# Patient Record
Sex: Female | Born: 1985 | Race: White | Hispanic: No | Marital: Single | Smoking: Never smoker
Health system: Southern US, Community
[De-identification: ages and names within clinical notes are randomized; demographics above are authoritative.]

## PROBLEM LIST (undated history)

## (undated) DIAGNOSIS — K219 Gastro-esophageal reflux disease without esophagitis: Secondary | ICD-10-CM

## (undated) DIAGNOSIS — N83201 Unspecified ovarian cyst, right side: Secondary | ICD-10-CM

## (undated) DIAGNOSIS — Z973 Presence of spectacles and contact lenses: Secondary | ICD-10-CM

## (undated) DIAGNOSIS — Z8619 Personal history of other infectious and parasitic diseases: Secondary | ICD-10-CM

## (undated) HISTORY — DX: Personal history of other infectious and parasitic diseases: Z86.19

## (undated) HISTORY — PX: WISDOM TOOTH EXTRACTION: SHX21

## (undated) HISTORY — PX: NO PAST SURGERIES: SHX2092

---
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
## 2018-10-13 ENCOUNTER — Encounter: Payer: Self-pay | Admitting: Physician Assistant

## 2018-10-13 ENCOUNTER — Ambulatory Visit (HOSPITAL_BASED_OUTPATIENT_CLINIC_OR_DEPARTMENT_OTHER)
Admission: RE | Admit: 2018-10-13 | Discharge: 2018-10-13 | Disposition: A | Discharge status: Home / Self Care | Payer: BC Managed Care – PPO | Source: Ambulatory Visit | Attending: Physician Assistant | Admitting: Physician Assistant

## 2018-10-13 ENCOUNTER — Ambulatory Visit (INDEPENDENT_AMBULATORY_CARE_PROVIDER_SITE_OTHER): Payer: BC Managed Care – PPO | Admitting: Physician Assistant

## 2018-10-13 ENCOUNTER — Other Ambulatory Visit: Payer: Self-pay

## 2018-10-13 VITALS — BP 100/70 | HR 92 | Temp 98.4°F | Resp 14 | Ht 64.0 in | Wt 150.0 lb

## 2018-10-13 DIAGNOSIS — Z299 Encounter for prophylactic measures, unspecified: Secondary | ICD-10-CM

## 2018-10-13 DIAGNOSIS — R51 Headache: Secondary | ICD-10-CM | POA: Insufficient documentation

## 2018-10-13 DIAGNOSIS — R519 Headache, unspecified: Secondary | ICD-10-CM

## 2018-10-13 MED ORDER — KETOROLAC TROMETHAMINE 60 MG/2ML IM SOLN
60.0000 mg | Freq: Once | INTRAMUSCULAR | Status: AC
  Administered 2018-10-13: 16:00:00 60 mg via INTRAMUSCULAR

## 2018-10-13 NOTE — Progress Notes (Signed)
Jeanne [name] 34 y.o. female presents to office today for NP appt. Per PCP, Raiford Noble, PA, administered KETORIAC TROMETHAMINE 60 mg per 2 mL IM right ventrogluteal. Patient tolerated well.

## 2018-10-13 NOTE — Patient Instructions (Signed)
Please go to the lab today for blood work.  I will call you with your results. We will alter treatment regimen(s) if indicated by your results.   Please stop at the front desk to speak with Danae Chen about your CT scan.  If you note any acutely worsening symptoms before workup is complete, please go to the ER.

## 2018-10-13 NOTE — Progress Notes (Signed)
Patient presents to clinic today to establish care.  Acute Concerns: Patient endorses a headache x 5.5 days. Notes on Wed night -- driving home from grocery -- acute onset of headache (not severe) but was noticeable and not like anything she has had. Notes these are not normal for her. Is bilaterally in temporal regions. Non-radiating. No neck stiffness. Denies nausea/vomiting, photophobia or phonophobia. Denies trauma or injury. Denies change to diet or hydration. Denies no vision changes.Denies sinus pressure, nasal congestion or other URI symptoms.  Aching that is just present. Ranks about 2-3/10. LMP -- [facility] July 20th. Has taken tylenol and ibuprofen with no improvement in symptoms.   Past Medical History:  Diagnosis Date  . GERD (gastroesophageal reflux disease)   . History of chickenpox     Past Surgical History:  Procedure Laterality Date  . NO PAST SURGERIES      No current outpatient medications on file prior to visit.   No current facility-administered medications on file prior to visit.     No Known Allergies  Family History  Problem Relation Age of Onset  . Colon polyps Mother 35  . Alcohol abuse Father   . Early death Father   . Arthritis Maternal Grandmother   . Dementia Maternal Grandmother   . Alcohol abuse Maternal Grandfather   . Diabetes Paternal Grandmother   . Heart disease Paternal Grandmother   . Diabetes Paternal Grandfather   . Heart disease Paternal Grandfather   . Hyperlipidemia Paternal Grandfather   . Hypertension Paternal Grandfather   . Kidney disease Paternal Grandfather   . Stroke Paternal Grandfather     Social History   Socioeconomic History  . Marital status: Single    Spouse name: Not on file  . Number of children: Not on file  . Years of education: Not on file  . Highest education level: Not on file  Occupational History  . Not on file  Social Needs  . Financial resource strain: Not on file  . Food insecurity   Worry: Not on file    Inability: Not on file  . Transportation needs    Medical: Not on file    Non-medical: Not on file  Tobacco Use  . Smoking status: Never Smoker  . Smokeless tobacco: Never Used  Substance and Sexual Activity  . Alcohol use: Yes  . Drug use: Not Currently  . Sexual activity: Yes  Lifestyle  . Physical activity    Days per week: Not on file    Minutes per session: Not on file  . Stress: Not on file  Relationships  . Social Herbalist on phone: Not on file    Gets together: Not on file    Attends religious service: Not on file    Active member of club or organization: Not on file    Attends meetings of clubs or organizations: Not on file    Relationship status: Not on file  . Intimate partner violence    Fear of current or ex partner: Not on file    Emotionally abused: Not on file    Physically abused: Not on file    Forced sexual activity: Not on file  Other Topics Concern  . Not on file  Social History Narrative  . Not on file   Review of Systems  Constitutional: Negative for chills, fever, malaise/fatigue and weight loss.  HENT: Negative for ear pain and hearing loss.   Eyes: Negative for blurred vision, double vision,  photophobia and pain.  Respiratory: Negative for cough and shortness of breath.   Cardiovascular: Negative for chest pain and palpitations.  Neurological: Positive for headaches. Negative for dizziness, sensory change, speech change, focal weakness, seizures, loss of consciousness and weakness.  Psychiatric/Behavioral: Negative for depression. The patient is not nervous/anxious.    BP 100/70   Pulse 92   Temp 98.4 F (36.9 C) (Skin)   Resp 14   Ht '5\' 4"'$  (1.626 m)   Wt 150 lb (68 kg)   SpO2 99%   BMI 25.75 kg/m   Physical Exam Vitals signs reviewed.  Constitutional:      Appearance: She is well-developed.  HENT:     Head: Normocephalic and atraumatic.     Mouth/Throat:     Mouth: Mucous membranes are moist.   Eyes:     Conjunctiva/sclera: Conjunctivae normal.     Pupils: Pupils are equal, round, and reactive to light.  Neck:     Musculoskeletal: Neck supple.  Cardiovascular:     Rate and Rhythm: Normal rate and regular rhythm.     Heart sounds: Normal heart sounds.  Pulmonary:     Effort: Pulmonary effort is normal.     Breath sounds: Normal breath sounds.  Neurological:     General: No focal deficit present.     Mental Status: She is alert and oriented to person, place, and time.     GCS: GCS eye subscore is 4. GCS verbal subscore is 5. GCS motor subscore is 6.     Cranial Nerves: No cranial nerve deficit or facial asymmetry.     Sensory: No sensory deficit.  Psychiatric:        Mood and Affect: Mood normal. Mood is not anxious.        Speech: Speech normal.    Assessment/Plan: 1. Preventive measure Labs today.  - Comp Met (CMET) - TSH - Lipid panel  2. Acute intractable headache, unspecified headache type Exam unremarkable but this is a new headache for her, present for 5 days without improvement despite home measures. Giving intractable headache will check CT to r/o any concerning findings. Labs today as noted. IM Toradol given for abortive therapy. ER precautions reviewed with patient.  - CBC w/Diff - Comp Met (CMET) - Sedimentation rate - TSH - ketorolac (TORADOL) injection 60 mg - CT Head Wo Contrast; Future   Leeanne Rio, PA-C

## 2018-10-14 ENCOUNTER — Other Ambulatory Visit: Payer: Self-pay | Admitting: Physician Assistant

## 2018-10-14 DIAGNOSIS — G4452 New daily persistent headache (NDPH): Secondary | ICD-10-CM

## 2018-10-14 LAB — CBC WITH DIFFERENTIAL/PLATELET
Basophils Absolute: 0.1 10*3/uL (ref 0.0–0.1)
Basophils Relative: 0.8 % (ref 0.0–3.0)
Eosinophils Absolute: 0.2 10*3/uL (ref 0.0–0.7)
Eosinophils Relative: 2.6 % (ref 0.0–5.0)
HCT: 37.9 % (ref 36.0–46.0)
Hemoglobin: 12.5 g/dL (ref 12.0–15.0)
Lymphocytes Relative: 28.6 % (ref 12.0–46.0)
Lymphs Abs: 1.8 10*3/uL (ref 0.7–4.0)
MCHC: 32.9 g/dL (ref 30.0–36.0)
MCV: 85.7 fl (ref 78.0–100.0)
Monocytes Absolute: 0.4 10*3/uL (ref 0.1–1.0)
Monocytes Relative: 6.7 % (ref 3.0–12.0)
Neutro Abs: 3.8 10*3/uL (ref 1.4–7.7)
Neutrophils Relative %: 61.3 % (ref 43.0–77.0)
Platelets: 194 10*3/uL (ref 150.0–400.0)
RBC: 4.42 Mil/uL (ref 3.87–5.11)
RDW: 14.4 % (ref 11.5–15.5)
WBC: 6.2 10*3/uL (ref 4.0–10.5)

## 2018-10-14 LAB — COMPREHENSIVE METABOLIC PANEL
ALT: 12 U/L (ref 0–35)
AST: 14 U/L (ref 0–37)
Albumin: 4.5 g/dL (ref 3.5–5.2)
Alkaline Phosphatase: 40 U/L (ref 39–117)
BUN: 14 mg/dL (ref 6–23)
CO2: 26 mEq/L (ref 19–32)
Calcium: 9.5 mg/dL (ref 8.4–10.5)
Chloride: 105 mEq/L (ref 96–112)
Creatinine, Ser: 0.71 mg/dL (ref 0.40–1.20)
GFR: 93.39 mL/min (ref >60.00)
Glucose, Bld: 94 mg/dL (ref 70–99)
Potassium: 4.1 mEq/L (ref 3.5–5.1)
Sodium: 138 mEq/L (ref 135–145)
Total Bilirubin: 0.4 mg/dL (ref 0.2–1.2)
Total Protein: 6.9 g/dL (ref 6.0–8.3)

## 2018-10-14 LAB — LIPID PANEL
Cholesterol: 158 mg/dL (ref 0–200)
HDL: 49.9 mg/dL (ref >39.00)
NonHDL: 108.02
Total CHOL/HDL Ratio: 3
Triglycerides: 220 mg/dL — ABNORMAL HIGH (ref 0.0–149.0)
VLDL: 44 mg/dL — ABNORMAL HIGH (ref 0.0–40.0)

## 2018-10-14 LAB — LDL CHOLESTEROL, DIRECT: Direct LDL: 76 mg/dL

## 2018-10-14 LAB — TSH: TSH: 2.12 u[IU]/mL (ref 0.35–4.50)

## 2018-10-14 LAB — SEDIMENTATION RATE: Sed Rate: 7 mm/hr (ref 0–20)

## 2018-10-30 ENCOUNTER — Encounter: Payer: Self-pay | Admitting: Physician Assistant

## 2018-10-30 NOTE — Telephone Encounter (Signed)
Please check on status of MRI and reply to patient. Thank you.

## 2018-11-01 ENCOUNTER — Other Ambulatory Visit: Payer: Self-pay

## 2018-11-01 ENCOUNTER — Ambulatory Visit (HOSPITAL_BASED_OUTPATIENT_CLINIC_OR_DEPARTMENT_OTHER)
Admission: RE | Admit: 2018-11-01 | Discharge: 2018-11-01 | Disposition: A | Discharge status: Home / Self Care | Payer: BC Managed Care – PPO | Source: Ambulatory Visit | Attending: Physician Assistant | Admitting: Physician Assistant

## 2018-11-01 DIAGNOSIS — R51 Headache: Secondary | ICD-10-CM | POA: Diagnosis not present

## 2018-11-01 DIAGNOSIS — G4452 New daily persistent headache (NDPH): Secondary | ICD-10-CM | POA: Diagnosis not present

## 2018-11-01 MED ORDER — GADOBUTROL 1 MMOL/ML IV SOLN
6.8000 mL | Freq: Once | INTRAVENOUS | Status: AC | PRN
  Administered 2018-11-01: 11:00:00 6.8 mL via INTRAVENOUS

## 2019-01-27 DIAGNOSIS — Z01419 Encounter for gynecological examination (general) (routine) without abnormal findings: Secondary | ICD-10-CM | POA: Diagnosis not present

## 2019-01-27 DIAGNOSIS — Z3202 Encounter for pregnancy test, result negative: Secondary | ICD-10-CM | POA: Diagnosis not present

## 2019-01-27 DIAGNOSIS — R1031 Right lower quadrant pain: Secondary | ICD-10-CM | POA: Diagnosis not present

## 2019-01-27 DIAGNOSIS — Z6826 Body mass index (BMI) 26.0-26.9, adult: Secondary | ICD-10-CM | POA: Diagnosis not present

## 2019-08-18 ENCOUNTER — Other Ambulatory Visit: Payer: Self-pay | Admitting: Radiology

## 2019-08-18 DIAGNOSIS — R1031 Right lower quadrant pain: Secondary | ICD-10-CM

## 2019-09-09 ENCOUNTER — Ambulatory Visit
Admission: RE | Admit: 2019-09-09 | Discharge: 2019-09-09 | Disposition: A | Discharge status: Home / Self Care | Payer: BC Managed Care – PPO | Source: Ambulatory Visit | Attending: Radiology | Admitting: Radiology

## 2019-09-09 ENCOUNTER — Other Ambulatory Visit: Payer: Self-pay

## 2019-09-09 DIAGNOSIS — R1031 Right lower quadrant pain: Secondary | ICD-10-CM

## 2019-09-09 MED ORDER — IOPAMIDOL (ISOVUE-300) INJECTION 61%
100.0000 mL | Freq: Once | INTRAVENOUS | Status: AC | PRN
  Administered 2019-09-09: 100 mL via INTRAVENOUS

## 2019-12-16 NOTE — H&P (Signed)
34 year old female with persistent right ovarian cyst  Last scan cyst was 6.9 cm  Past Medical History:  Diagnosis Date  . GERD (gastroesophageal reflux disease)   . History of chickenpox    Past Surgical History:  Procedure Laterality Date  . NO PAST SURGERIES     Prior to Admission medications   Not on File   Allergies  NKDA  Social History   Socioeconomic History  . Marital status: Single    Spouse name: Not on file  . Number of children: Not on file  . Years of education: Not on file  . Highest education level: Not on file  Occupational History  . Not on file  Tobacco Use  . Smoking status: Never Smoker  . Smokeless tobacco: Never Used  Vaping Use  . Vaping Use: Never used  Substance and Sexual Activity  . Alcohol use: Yes    Comment: Socially  . Drug use: Never  . Sexual activity: Yes  Other Topics Concern  . Not on file  Social History Narrative  . Not on file   Social Determinants of Health   Financial Resource Strain:   . Difficulty of Paying Living Expenses: Not on file  Food Insecurity:   . Worried About Programme researcher, broadcasting/film/video in the Last Year: Not on file  . Ran Out of Food in the Last Year: Not on file  Transportation Needs:   . Lack of Transportation (Medical): Not on file  . Lack of Transportation (Non-Medical): Not on file  Physical Activity:   . Days of Exercise per Week: Not on file  . Minutes of Exercise per Session: Not on file  Stress:   . Feeling of Stress : Not on file  Social Connections:   . Frequency of Communication with Friends and Family: Not on file  . Frequency of Social Gatherings with Friends and Family: Not on file  . Attends Religious Services: Not on file  . Active Member of Clubs or Organizations: Not on file  . Attends Banker Meetings: Not on file  . Marital Status: Not on file   Family History  Problem Relation Age of Onset  . Colon polyps Mother 64       Pre-cancerous  . Alcohol abuse Father   .  Early death Father   . Arthritis Maternal Grandmother   . Dementia Maternal Grandmother   . Alcohol abuse Maternal Grandfather   . Diabetes Paternal Grandmother   . Heart disease Paternal Grandmother   . Diabetes Paternal Grandfather   . Heart disease Paternal Grandfather   . Hyperlipidemia Paternal Grandfather   . Hypertension Paternal Grandfather   . Kidney disease Paternal Grandfather   . Stroke Paternal Grandfather    General alert and oriented Lung CTAB Car RRR Abdomen is soft and non tender Palpable right adnexal mass noted  IMPRESSION: Persistent Right ovarian cyst  PLAN: Diagnostic laparoscopy Right ovarian cystectomy Possible right oophorectomy depending on nature of cyst noted at time of surgery

## 2019-12-29 ENCOUNTER — Encounter (HOSPITAL_BASED_OUTPATIENT_CLINIC_OR_DEPARTMENT_OTHER): Payer: Self-pay | Admitting: Obstetrics and Gynecology

## 2019-12-29 ENCOUNTER — Other Ambulatory Visit: Payer: Self-pay

## 2019-12-29 NOTE — Progress Notes (Signed)
Spoke w/ via phone for pre-op interview--- PT Lab needs dos-- CBCdiff and Urine preg            Lab results------ no COVID test ------  per pt getting it done at [facility] on 01-04-2020 then knows to self quarantine until [employer] at ------- 0530 NPO after MN NO Solid Food.  Clear liquids from MN until--- 0430 Medications to take morning of surgery ----- NONE Diabetic medication ----- n/a Patient Special Instructions ----- n/a Pre-Op special Istructions ----- n/a Patient verbalized understanding of instructions that were given at this phone interview. Patient denies shortness of breath, chest pain, fever, cough at this phone interview.

## 2020-01-04 ENCOUNTER — Encounter (HOSPITAL_COMMUNITY)
Admission: RE | Admit: 2020-01-04 | Discharge: 2020-01-04 | Disposition: A | Discharge status: Home / Self Care | Payer: BC Managed Care – PPO | Attending: Obstetrics and Gynecology | Admitting: Obstetrics and Gynecology

## 2020-01-04 DIAGNOSIS — Z20822 Contact with and (suspected) exposure to covid-19: Secondary | ICD-10-CM | POA: Diagnosis not present

## 2020-01-04 DIAGNOSIS — Z01812 Encounter for preprocedural laboratory examination: Secondary | ICD-10-CM | POA: Diagnosis not present

## 2020-01-04 LAB — SARS CORONAVIRUS 2 (TAT 6-24 HRS): SARS Coronavirus 2: NEGATIVE

## 2020-01-06 NOTE — Anesthesia Preprocedure Evaluation (Addendum)
Anesthesia Evaluation  Patient identified by MRN, date of birth, ID band Patient awake    Reviewed: Allergy & Precautions, NPO status , Patient's Chart, lab work & pertinent test results  Airway Mallampati: II  TM Distance: >3 FB Neck ROM: Full    Dental no notable dental hx. (+) Teeth Intact, Dental Advisory Given   Pulmonary neg pulmonary ROS,    Pulmonary exam normal breath sounds clear to auscultation       Cardiovascular negative cardio ROS Normal cardiovascular exam Rhythm:Regular Rate:Normal     Neuro/Psych negative neurological ROS  negative psych ROS   GI/Hepatic Neg liver ROS, GERD  Controlled,  Endo/Other  negative endocrine ROS  Renal/GU negative Renal ROS  negative genitourinary   Musculoskeletal negative musculoskeletal ROS (+)   Abdominal Normal abdominal exam  (+)   Peds  Hematology negative hematology ROS (+)   Anesthesia Other Findings   Reproductive/Obstetrics Right ovarian cyst                            Anesthesia Physical Anesthesia Plan  ASA: I  Anesthesia Plan: General   Post-op Pain Management:    Induction: Intravenous  PONV Risk Score and Plan: 3 and Ondansetron, Dexamethasone, Propofol infusion, TIVA, Treatment may vary due to age or medical condition and Midazolam  Airway Management Planned: Oral ETT  Additional Equipment: None  Intra-op Plan:   Post-operative Plan: Extubation in OR  Informed Consent: I have reviewed the patients History and Physical, chart, labs and discussed the procedure including the risks, benefits and alternatives for the proposed anesthesia with the patient or authorized representative who has indicated his/her understanding and acceptance.     Dental advisory given  Plan Discussed with: CRNA  Anesthesia Plan Comments:        Anesthesia Quick Evaluation

## 2020-01-07 ENCOUNTER — Ambulatory Visit (HOSPITAL_BASED_OUTPATIENT_CLINIC_OR_DEPARTMENT_OTHER): Payer: BC Managed Care – PPO | Admitting: Anesthesiology

## 2020-01-07 ENCOUNTER — Encounter (HOSPITAL_BASED_OUTPATIENT_CLINIC_OR_DEPARTMENT_OTHER)
Admission: RE | Disposition: A | Discharge status: Home / Self Care | Payer: Self-pay | Source: Home / Self Care | Attending: Obstetrics and Gynecology

## 2020-01-07 ENCOUNTER — Ambulatory Visit (HOSPITAL_BASED_OUTPATIENT_CLINIC_OR_DEPARTMENT_OTHER)
Admission: RE | Admit: 2020-01-07 | Discharge: 2020-01-07 | Disposition: A | Discharge status: Home / Self Care | Payer: BC Managed Care – PPO | Attending: Obstetrics and Gynecology | Admitting: Obstetrics and Gynecology

## 2020-01-07 ENCOUNTER — Encounter (HOSPITAL_BASED_OUTPATIENT_CLINIC_OR_DEPARTMENT_OTHER): Payer: Self-pay | Admitting: Obstetrics and Gynecology

## 2020-01-07 DIAGNOSIS — N83291 Other ovarian cyst, right side: Secondary | ICD-10-CM | POA: Insufficient documentation

## 2020-01-07 DIAGNOSIS — N83201 Unspecified ovarian cyst, right side: Secondary | ICD-10-CM | POA: Diagnosis present

## 2020-01-07 HISTORY — DX: Unspecified ovarian cyst, right side: N83.201

## 2020-01-07 HISTORY — DX: Presence of spectacles and contact lenses: Z97.3

## 2020-01-07 HISTORY — PX: LAPAROSCOPIC OVARIAN CYSTECTOMY: SHX6248

## 2020-01-07 LAB — CBC WITH DIFFERENTIAL/PLATELET
Abs Immature Granulocytes: 0.01 10*3/uL (ref 0.00–0.07)
Basophils Absolute: 0 10*3/uL (ref 0.0–0.1)
Basophils Relative: 1 %
Eosinophils Absolute: 0.1 10*3/uL (ref 0.0–0.5)
Eosinophils Relative: 2 %
HCT: 40.6 % (ref 36.0–46.0)
Hemoglobin: 13.4 g/dL (ref 12.0–15.0)
Immature Granulocytes: 0 %
Lymphocytes Relative: 34 %
Lymphs Abs: 2.1 10*3/uL (ref 0.7–4.0)
MCH: 28.2 pg (ref 26.0–34.0)
MCHC: 33 g/dL (ref 30.0–36.0)
MCV: 85.5 fL (ref 80.0–100.0)
Monocytes Absolute: 0.6 10*3/uL (ref 0.1–1.0)
Monocytes Relative: 9 %
Neutro Abs: 3.3 10*3/uL (ref 1.7–7.7)
Neutrophils Relative %: 54 %
Platelets: 208 10*3/uL (ref 150–400)
RBC: 4.75 MIL/uL (ref 3.87–5.11)
RDW: 14.7 % (ref 11.5–15.5)
WBC: 6.1 10*3/uL (ref 4.0–10.5)
nRBC: 0 % (ref 0.0–0.2)

## 2020-01-07 LAB — POCT PREGNANCY, URINE: Preg Test, Ur: NEGATIVE

## 2020-01-07 SURGERY — EXCISION, CYST, OVARY, LAPAROSCOPIC
Anesthesia: General | Site: Abdomen | Laterality: Right

## 2020-01-07 MED ORDER — LIDOCAINE 2% (20 MG/ML) 5 ML SYRINGE
INTRAMUSCULAR | Status: DC | PRN
  Administered 2020-01-07: 60 mg via INTRAVENOUS

## 2020-01-07 MED ORDER — CHLORHEXIDINE GLUCONATE CLOTH 2 % EX PADS: 6.0000 | MEDICATED_PAD | Freq: Once | CUTANEOUS | Status: DC

## 2020-01-07 MED ORDER — ACETAMINOPHEN 10 MG/ML IV SOLN
INTRAVENOUS | Status: DC | PRN
  Administered 2020-01-07: 1000 mg via INTRAVENOUS

## 2020-01-07 MED ORDER — PROPOFOL 10 MG/ML IV BOLUS
INTRAVENOUS | Status: AC
  Filled 2020-01-07: qty 20

## 2020-01-07 MED ORDER — DEXAMETHASONE SODIUM PHOSPHATE 10 MG/ML IJ SOLN
INTRAMUSCULAR | Status: DC | PRN
  Administered 2020-01-07: 5 mg via INTRAVENOUS

## 2020-01-07 MED ORDER — CEFAZOLIN SODIUM-DEXTROSE 2-4 GM/100ML-% IV SOLN
INTRAVENOUS | Status: AC
  Filled 2020-01-07: qty 100

## 2020-01-07 MED ORDER — LACTATED RINGERS IV SOLN: INTRAVENOUS | Status: DC

## 2020-01-07 MED ORDER — ACETAMINOPHEN 10 MG/ML IV SOLN
INTRAVENOUS | Status: AC
  Filled 2020-01-07: qty 100

## 2020-01-07 MED ORDER — BUPIVACAINE HCL (PF) 0.25 % IJ SOLN
INTRAMUSCULAR | Status: DC | PRN
  Administered 2020-01-07: 6 mL

## 2020-01-07 MED ORDER — IBUPROFEN 800 MG PO TABS: 800.0000 mg | ORAL_TABLET | Freq: Three times a day (TID) | ORAL | Status: DC

## 2020-01-07 MED ORDER — FENTANYL CITRATE (PF) 100 MCG/2ML IJ SOLN
INTRAMUSCULAR | Status: DC | PRN
  Administered 2020-01-07: 50 ug via INTRAVENOUS
  Administered 2020-01-07 (×2): 25 ug via INTRAVENOUS
  Administered 2020-01-07 (×3): 50 ug via INTRAVENOUS

## 2020-01-07 MED ORDER — KETOROLAC TROMETHAMINE 30 MG/ML IJ SOLN: 30.0000 mg | Freq: Once | INTRAMUSCULAR | Status: DC | PRN

## 2020-01-07 MED ORDER — FENTANYL CITRATE (PF) 250 MCG/5ML IJ SOLN
INTRAMUSCULAR | Status: AC
  Filled 2020-01-07: qty 5

## 2020-01-07 MED ORDER — MIDAZOLAM HCL 5 MG/5ML IJ SOLN
INTRAMUSCULAR | Status: DC | PRN
  Administered 2020-01-07: 1 mg via INTRAVENOUS
  Administered 2020-01-07: 2 mg via INTRAVENOUS

## 2020-01-07 MED ORDER — ACETAMINOPHEN 500 MG PO TABS
ORAL_TABLET | ORAL | Status: AC
  Filled 2020-01-07: qty 2

## 2020-01-07 MED ORDER — SODIUM CHLORIDE 0.9 % IR SOLN
Status: DC | PRN
  Administered 2020-01-07: 3000 mL

## 2020-01-07 MED ORDER — SCOPOLAMINE 1 MG/3DAYS TD PT72
MEDICATED_PATCH | TRANSDERMAL | Status: AC
  Filled 2020-01-07: qty 1

## 2020-01-07 MED ORDER — PROPOFOL 10 MG/ML IV BOLUS
INTRAVENOUS | Status: AC
  Filled 2020-01-07: qty 60

## 2020-01-07 MED ORDER — OXYCODONE HCL 5 MG PO TABS: 5.0000 mg | ORAL_TABLET | Freq: Once | ORAL | Status: DC | PRN

## 2020-01-07 MED ORDER — MEPERIDINE HCL 25 MG/ML IJ SOLN: 6.2500 mg | INTRAMUSCULAR | Status: DC | PRN

## 2020-01-07 MED ORDER — KETAMINE HCL 10 MG/ML IJ SOLN
INTRAMUSCULAR | Status: AC
  Filled 2020-01-07: qty 1

## 2020-01-07 MED ORDER — PROMETHAZINE HCL 25 MG/ML IJ SOLN: 6.2500 mg | INTRAMUSCULAR | Status: DC | PRN

## 2020-01-07 MED ORDER — SUGAMMADEX SODIUM 200 MG/2ML IV SOLN
INTRAVENOUS | Status: DC | PRN
  Administered 2020-01-07: 125 mg via INTRAVENOUS

## 2020-01-07 MED ORDER — SCOPOLAMINE 1 MG/3DAYS TD PT72
1.0000 | MEDICATED_PATCH | TRANSDERMAL | Status: DC
  Administered 2020-01-07: 1.5 mg via TRANSDERMAL

## 2020-01-07 MED ORDER — ROCURONIUM BROMIDE 100 MG/10ML IV SOLN
INTRAVENOUS | Status: DC | PRN
  Administered 2020-01-07: 50 mg via INTRAVENOUS

## 2020-01-07 MED ORDER — PROPOFOL 10 MG/ML IV BOLUS
INTRAVENOUS | Status: DC | PRN
  Administered 2020-01-07: 100 mg via INTRAVENOUS

## 2020-01-07 MED ORDER — DEXMEDETOMIDINE (PRECEDEX) IN NS 20 MCG/5ML (4 MCG/ML) IV SYRINGE
PREFILLED_SYRINGE | INTRAVENOUS | Status: DC | PRN
  Administered 2020-01-07: 8 ug via INTRAVENOUS

## 2020-01-07 MED ORDER — HYDROMORPHONE HCL 1 MG/ML IJ SOLN: 0.2500 mg | INTRAMUSCULAR | Status: DC | PRN

## 2020-01-07 MED ORDER — ACETAMINOPHEN 500 MG PO TABS: 1000.0000 mg | ORAL_TABLET | ORAL | Status: DC

## 2020-01-07 MED ORDER — ONDANSETRON HCL 4 MG/2ML IJ SOLN
INTRAMUSCULAR | Status: DC | PRN
  Administered 2020-01-07: 4 mg via INTRAVENOUS

## 2020-01-07 MED ORDER — OXYCODONE HCL 5 MG/5ML PO SOLN: 5.0000 mg | Freq: Once | ORAL | Status: DC | PRN

## 2020-01-07 MED ORDER — KETAMINE HCL 10 MG/ML IJ SOLN
INTRAMUSCULAR | Status: DC | PRN
  Administered 2020-01-07: 30 mg via INTRAVENOUS

## 2020-01-07 MED ORDER — MIDAZOLAM HCL 2 MG/2ML IJ SOLN
INTRAMUSCULAR | Status: AC
  Filled 2020-01-07: qty 4

## 2020-01-07 MED ORDER — OXYCODONE HCL 5 MG PO TABS: 5.0000 mg | ORAL_TABLET | ORAL | Status: DC | PRN

## 2020-01-07 MED ORDER — CEFAZOLIN SODIUM-DEXTROSE 2-4 GM/100ML-% IV SOLN
2.0000 g | INTRAVENOUS | Status: AC
  Administered 2020-01-07: 2 g via INTRAVENOUS

## 2020-01-07 MED ORDER — PROPOFOL 500 MG/50ML IV EMUL
INTRAVENOUS | Status: DC | PRN
  Administered 2020-01-07: 125 ug/kg/min via INTRAVENOUS

## 2020-01-07 SURGICAL SUPPLY — 40 items
ADH SKN CLS APL DERMABOND .7 (GAUZE/BANDAGES/DRESSINGS) ×1
CABLE HIGH FREQUENCY MONO STRZ (ELECTRODE) ×3 IMPLANT
CANISTER SUCT 3000ML PPV (MISCELLANEOUS) ×3 IMPLANT
CATH ROBINSON RED A/P 16FR (CATHETERS) ×3 IMPLANT
COVER MAYO STAND STRL (DRAPES) ×3 IMPLANT
COVER WAND RF STERILE (DRAPES) ×3 IMPLANT
DERMABOND ADVANCED (GAUZE/BANDAGES/DRESSINGS) ×2
DERMABOND ADVANCED .7 DNX12 (GAUZE/BANDAGES/DRESSINGS) ×1 IMPLANT
DRSG OPSITE POSTOP 3X4 (GAUZE/BANDAGES/DRESSINGS) ×3 IMPLANT
ELECT REM PT RETURN 9FT ADLT (ELECTROSURGICAL) ×3
ELECTRODE REM PT RTRN 9FT ADLT (ELECTROSURGICAL) ×1 IMPLANT
GAUZE 4X4 16PLY RFD (DISPOSABLE) ×3 IMPLANT
GLOVE BIO SURGEON STRL SZ 6.5 (GLOVE) ×4 IMPLANT
GLOVE BIO SURGEONS STRL SZ 6.5 (GLOVE) ×2
GLOVE BIOGEL PI IND STRL 7.0 (GLOVE) ×2 IMPLANT
GLOVE BIOGEL PI IND STRL 7.5 (GLOVE) ×2 IMPLANT
GLOVE BIOGEL PI INDICATOR 7.0 (GLOVE) ×4
GLOVE BIOGEL PI INDICATOR 7.5 (GLOVE) ×4
GOWN STRL REUS W/ TWL LRG LVL3 (GOWN DISPOSABLE) ×3 IMPLANT
GOWN STRL REUS W/TWL LRG LVL3 (GOWN DISPOSABLE) ×9
KIT TURNOVER CYSTO (KITS) ×3 IMPLANT
NEEDLE HYPO 25X1 1.5 SAFETY (NEEDLE) ×3 IMPLANT
NEEDLE INSUFFLATION 120MM (ENDOMECHANICALS) ×3 IMPLANT
NS IRRIG 500ML POUR BTL (IV SOLUTION) ×3 IMPLANT
PACK LAPAROSCOPY BASIN (CUSTOM PROCEDURE TRAY) ×3 IMPLANT
PACK TRENDGUARD 450 HYBRID PRO (MISCELLANEOUS) ×1 IMPLANT
PAD OB MATERNITY 4.3X12.25 (PERSONAL CARE ITEMS) ×3 IMPLANT
PAD PREP 24X48 CUFFED NSTRL (MISCELLANEOUS) ×3 IMPLANT
SEALER TISSUE G2 CVD JAW 45CM (ENDOMECHANICALS) ×3 IMPLANT
SET SUCTION IRRIG HYDROSURG (IRRIGATION / IRRIGATOR) ×3 IMPLANT
SET TUBE SMOKE EVAC HIGH FLOW (TUBING) ×3 IMPLANT
SUT VIC AB 3-0 PS2 18 (SUTURE) ×3
SUT VIC AB 3-0 PS2 18XBRD (SUTURE) ×1 IMPLANT
SUT VICRYL 0 UR6 27IN ABS (SUTURE) ×3 IMPLANT
TOWEL OR 17X26 10 PK STRL BLUE (TOWEL DISPOSABLE) ×3 IMPLANT
TRENDGUARD 450 HYBRID PRO PACK (MISCELLANEOUS) ×3
TROCAR OPTI TIP 5M 100M (ENDOMECHANICALS) ×3 IMPLANT
TROCAR XCEL BLUNT TIP 100MML (ENDOMECHANICALS) ×3 IMPLANT
TROCAR XCEL DIL TIP R 11M (ENDOMECHANICALS) ×3 IMPLANT
WARMER LAPAROSCOPE (MISCELLANEOUS) ×3 IMPLANT

## 2020-01-07 NOTE — Anesthesia Procedure Notes (Signed)
Procedure Name: Intubation Date/Time: 01/07/2020 7:32 AM Performed by: Marny Lowenstein, CRNA Pre-anesthesia Checklist: Patient identified, Emergency Drugs available, Suction available and Patient being monitored Patient Re-evaluated:Patient Re-evaluated prior to induction Oxygen Delivery Method: Circle system utilized Preoxygenation: Pre-oxygenation with 100% oxygen Induction Type: IV induction Ventilation: Mask ventilation without difficulty Laryngoscope Size: Glidescope and 3 Grade View: Grade I Tube type: Oral Tube size: 6.5 mm Number of attempts: 1 Airway Equipment and Method: Rigid stylet Placement Confirmation: ETT inserted through vocal cords under direct vision,  positive ETCO2 and breath sounds checked- equal and bilateral Secured at: 19 cm Tube secured with: Tape Dental Injury: Teeth and Oropharynx as per pre-operative assessment

## 2020-01-07 NOTE — Op Note (Signed)
[name], [name] MEDICAL RECORD WN:[id number] ACCOUNT [id number] DATE OF BIRTH:1986/02/29 FACILITY: [facility] LOCATION: [facility] PHYSICIAN:Violanda Bobeck Rosita Fire, MD  OPERATIVE REPORT  DATE OF PROCEDURE:  01/07/2020  PREOPERATIVE DIAGNOSES:  Right ovarian cyst.  POSTOPERATIVE DIAGNOSES:  Right ovarian cyst.  PROCEDURE:   1.  Diagnostic laparoscopy. 2.  Right ovarian cystectomy.  SURGEON:  Marcelle Overlie, MD  ANESTHESIA:  General.  ESTIMATED BLOOD LOSS:  Minimal.  DRAINS:  None.  PATHOLOGY:  Right ovarian cyst wall.  DESCRIPTION OF PROCEDURE:  The patient was taken to the operating room after informed consent was obtained.  She was then prepped and draped in the usual sterile fashion after she was intubated without difficulty.  An in and out catheter was used to  empty the bladder and the uterine manipulator was inserted in standard fashion.  Attention was turned to the abdomen where a small incision was made in the umbilicus after local was infiltrated.  The Veress needle was inserted.  Pneumoperitoneum was  performed.  We then attempted an 11 mm trocar insertion, but intraperitoneal placement could not be confirmed, so I proceeded to open the peritoneum directly.  Using a series of Mayo scissors and Allis clamps, we opened the peritoneum, confirmed  intraperitoneal placement.  No adhesions were noted.  Angle stitch was placed with 0 Vicryl suture.  The Hasson trocar was inserted.  Pneumoperitoneum was then performed.  The laparoscope was introduced through the trocar sheath.  The patient was placed  in Trendelenburg position.  A secondary 5 mm trocar was placed suprapubically.  Inspection of the pelvis revealed the following:  The uterus was normal except for a small pedunculated fibroid that was attached to the left side of the top of the uterus.   Left tube and ovary were normal.  Right fallopian tube was normal.  Right ovary was enlarged.  A smooth walled cyst was noted.  It was  about 7-8 cm in diameter.  No adhesions were noted.  No endometriosis noted.  We then proceeded to open up the cyst  wall using hot scissors.  The fluid was straw colored fluid.  It was suction irrigated.  The most tedious part of the surgery was actually dissecting the cyst wall out of the ovarian bed because at the very base of it, it seemed like it was very densely  adherent.  However, using the hot dissection, traction and hydrodissection, we were able to release the cyst after some time.  We removed the cyst wall and sent it to pathology.  We then cauterized the base of the ovary for hemostasis.  Hemostasis was  very good.  Irrigation was finally performed.  Pneumoperitoneum was released.  The trocars were removed.  The incisions were closed.  Dermabond was used for the skin, along with 3-0 Vicryl.  A bandage was applied.  All sponge, lap and instrument counts  were correct x2.  The patient went to recovery room extubated, in stable condition.  VN/NUANCE  D:01/07/2020 T:01/07/2020 JOB:[id number]

## 2020-01-07 NOTE — Anesthesia Postprocedure Evaluation (Signed)
Anesthesia Post Note  Patient: Jeanne [name]  Procedure(s) Performed: LAPAROSCOPIC OVARIAN CYSTECTOMY (Right Abdomen)     Patient location during evaluation: PACU Anesthesia Type: General Level of consciousness: awake and alert, oriented and patient cooperative Pain management: pain level controlled Vital Signs Assessment: post-procedure vital signs reviewed and stable Respiratory status: spontaneous breathing, nonlabored ventilation and respiratory function stable Cardiovascular status: blood pressure returned to baseline and stable Postop Assessment: no apparent nausea or vomiting Anesthetic complications: no   No complications documented.  Last Vitals:  Vitals:   01/07/20 0930 01/07/20 0945  BP: 109/71 111/74  Pulse: (!) 103 84  Resp: 15 16  Temp:    SpO2: 94% 93%    Last Pain:  Vitals:   01/07/20 0927  TempSrc:   PainSc: 0-No pain                 Lannie Fields

## 2020-01-07 NOTE — Discharge Instructions (Signed)
Post Anesthesia Home Care Instructions  Activity: Get plenty of rest for the remainder of the day. A responsible adult should stay with you for 24 hours following the procedure.  For the next 24 hours, DO NOT: -Drive a car -Operate machinery -Drink alcoholic beverages -Take any medication unless instructed by your physician -Make any legal decisions or sign important papers.  Meals: Start with liquid foods such as gelatin or soup. Progress to regular foods as tolerated. Avoid greasy, spicy, heavy foods. If nausea and/or vomiting occur, drink only clear liquids until the nausea and/or vomiting subsides. Call your physician if vomiting continues.  Special Instructions/Symptoms: Your throat may feel dry or sore from the anesthesia or the breathing tube placed in your throat during surgery. If this causes discomfort, gargle with warm salt water. The discomfort should disappear within 24 hours.  If you had a scopolamine patch placed behind your ear for the management of post- operative nausea and/or vomiting:  1. The medication in the patch is effective for 72 hours, after which it should be removed.  Wrap patch in a tissue and discard in the trash. Wash hands thoroughly with soap and water. 2. You may remove the patch earlier than 72 hours if you experience unpleasant side effects which may include dry mouth, dizziness or visual disturbances. 3. Avoid touching the patch. Wash your hands with soap and water after contact with the patch.   DISCHARGE INSTRUCTIONS: Laparoscopy  The following instructions have been prepared to help you care for yourself upon your return home today.  Wound care: . Do not get the incision wet for the first 24 hours. The incision should be kept clean and dry. . The Band-Aids or dressings may be removed the day after surgery. . Should the incision become sore, red, and swollen after the first week, check with your doctor.  Personal hygiene: . Shower the day  after your procedure.  Activity and limitations: . Do NOT drive or operate any equipment today. . Do NOT lift anything more than 15 pounds for 2-3 weeks after surgery. . Do NOT rest in bed all day. . Walking is encouraged. Walk each day, starting slowly with 5-minute walks 3 or 4 times a day. Slowly increase the length of your walks. . Walk up and down stairs slowly. . Do NOT do strenuous activities, such as golfing, playing tennis, bowling, running, biking, weight lifting, gardening, mowing, or vacuuming for 2-4 weeks. Ask your doctor when it is okay to start.  Diet: Eat a light meal as desired this evening. You may resume your usual diet tomorrow.  Return to work: This is dependent on the type of work you do. For the most part you can return to a desk job within a week of surgery. If you are more active at work, please discuss this with your doctor.  What to expect after your surgery: You may have a slight burning sensation when you urinate on the first day. You may have a very small amount of blood in the urine. Expect to have a small amount of vaginal discharge/light bleeding for 1-2 weeks. It is not unusual to have abdominal soreness and bruising for up to 2 weeks. You may be tired and need more rest for about 1 week. You may experience shoulder pain for 24-72 hours. Lying flat in bed may relieve it.  Call your doctor for any of the following: . Develop a fever of 100.4 or greater . Inability to urinate 6 hours after discharge   from hospital . Severe pain not relieved by pain medications . Persistent of heavy bleeding at incision site . Redness or swelling around incision site after a week . Increasing nausea or vomiting  Patient Signature________________________________________ Nurse Signature_________________________________________ 

## 2020-01-07 NOTE — Progress Notes (Signed)
H and P on the chart BP 117/72   Pulse (!) 104   Temp (!) 97 F (36.1 C) (Oral)   Resp 14   Ht 5\' 3"  (1.6 m)   Wt 60.2 kg   LMP 12/17/2019 (Exact Date)   SpO2 100%   BMI 23.51 kg/m  Results for orders placed or performed during the hospital encounter of 01/07/20 (from the past 24 hour(s))  Pregnancy, urine POC     Status: None   Collection Time: 01/07/20  6:02 AM  Result Value Ref Range   Preg Test, Ur NEGATIVE NEGATIVE  CBC WITH DIFFERENTIAL     Status: None   Collection Time: 01/07/20  6:30 AM  Result Value Ref Range   WBC 6.1 4.0 - 10.5 K/uL   RBC 4.75 3.87 - 5.11 MIL/uL   Hemoglobin 13.4 12.0 - 15.0 g/dL   HCT 01/09/20 36 - 46 %   MCV 85.5 80.0 - 100.0 fL   MCH 28.2 26.0 - 34.0 pg   MCHC 33.0 30.0 - 36.0 g/dL   RDW 64.6 80.3 - 21.2 %   Platelets 208 150 - 400 K/uL   nRBC 0.0 0.0 - 0.2 %   Neutrophils Relative % 54 %   Neutro Abs 3.3 1.7 - 7.7 K/uL   Lymphocytes Relative 34 %   Lymphs Abs 2.1 0.7 - 4.0 K/uL   Monocytes Relative 9 %   Monocytes Absolute 0.6 0.1 - 1.0 K/uL   Eosinophils Relative 2 %   Eosinophils Absolute 0.1 0.0 - 0.5 K/uL   Basophils Relative 1 %   Basophils Absolute 0.0 0.0 - 0.1 K/uL   Immature Granulocytes 0 %   Abs Immature Granulocytes 0.01 0.00 - 0.07 K/uL   Will proceed with Laparoscopy , right ovarian cystectomy and possible right oophorectomy

## 2020-01-07 NOTE — Brief Op Note (Signed)
01/07/2020  9:17 AM  PATIENT:  Jeanne [name]  34 y.o. female  PRE-OPERATIVE DIAGNOSIS:  RIGHT OVARIAN CYST  POST-OPERATIVE DIAGNOSIS:  RIGHT OVARIAN CYST  PROCEDURE:  Procedure(s): LAPAROSCOPIC OVARIAN CYSTECTOMY (Right)  SURGEON:  Surgeon(s) and Role:    * Marcelle Overlie, MD - Primary  PHYSICIAN ASSISTANT:   ASSISTANTS: none   ANESTHESIA:   general  EBL: minimal   BLOOD ADMINISTERED:none  DRAINS: none   LOCAL MEDICATIONS USED:  LIDOCAINE   SPECIMEN:  Source of Specimen:  right ovarian cyst wall  DISPOSITION OF SPECIMEN:  PATHOLOGY  COUNTS:  YES  TOURNIQUET:  * No tourniquets in log *  DICTATION: .Other Dictation: Dictation Number dictated  PLAN OF CARE: Discharge to home after PACU  PATIENT DISPOSITION:  PACU - hemodynamically stable.   Delay start of Pharmacological VTE agent (>24hrs) due to surgical blood loss or risk of bleeding: not applicable

## 2020-01-07 NOTE — Transfer of Care (Signed)
Immediate Anesthesia Transfer of Care Note  Patient: Jeanne [name]  Procedure(s) Performed: LAPAROSCOPIC OVARIAN CYSTECTOMY (Right Abdomen)  Patient Location: PACU  Anesthesia Type:General  Level of Consciousness: drowsy and patient cooperative  Airway & Oxygen Therapy: Patient Spontanous Breathing  Post-op Assessment: Report given to RN and Post -op Vital signs reviewed and stable  Post vital signs: Reviewed and stable  Last Vitals:  Vitals Value Taken Time  BP 121/75 01/07/20 0925  Temp 36.5 C 01/07/20 0927  Pulse 103 01/07/20 0930  Resp 15 01/07/20 0930  SpO2 94 % 01/07/20 0930  Vitals shown include unvalidated device data.  Last Pain:  Vitals:   01/07/20 0610  TempSrc: Oral  PainSc: 0-No pain      Patients Stated Pain Goal: 5 (01/07/20 0610)  Complications: No complications documented.

## 2020-01-08 ENCOUNTER — Encounter (HOSPITAL_BASED_OUTPATIENT_CLINIC_OR_DEPARTMENT_OTHER): Payer: Self-pay | Admitting: Obstetrics and Gynecology

## 2020-01-08 LAB — SURGICAL PATHOLOGY

## 2020-06-29 ENCOUNTER — Telehealth: Payer: Self-pay

## 2020-06-29 NOTE — Telephone Encounter (Signed)
LVM advising patient to call back and schedule TOC. 

## 2021-06-14 IMAGING — MR MRI HEAD WITHOUT AND WITH CONTRAST
7 series · 39 of 48 positions shown · IV contrast (gadavist)
Comparison: None.

CLINICAL DATA: Chronic headache with normal neuro exam.

EXAM:
MRI HEAD WITHOUT AND WITH CONTRAST
TECHNIQUE: Multiplanar, multiecho pulse sequences of the brain and surrounding
structures were obtained without and with intravenous contrast.
CONTRAST:  6.8 cc Gadavist intravenous

[Series 5: T2 · axial · 5.0mm · 0.69mm/px · z∈[-60,+102]mm · 4 of 28 slices shown (1 of 3)]
[im 1/28]
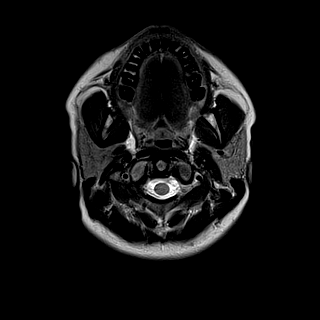
[im 10/28]
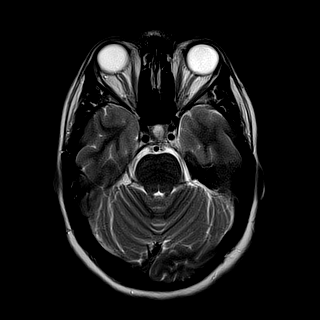
[im 19/28]
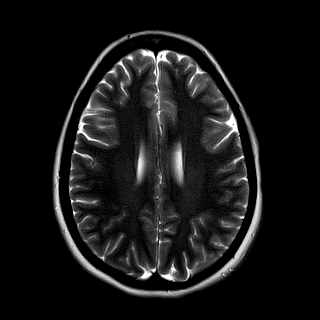
[im 28/28]
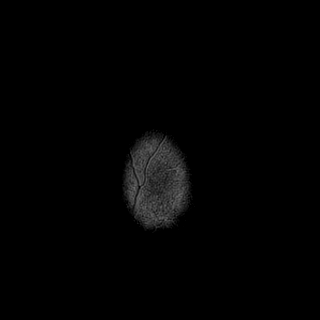

[Series 6: T2 · axial · 5.0mm · 0.43mm/px · z∈[-60,+102]mm · 4 of 28 slices shown (2 of 3)]
[im 1/28]
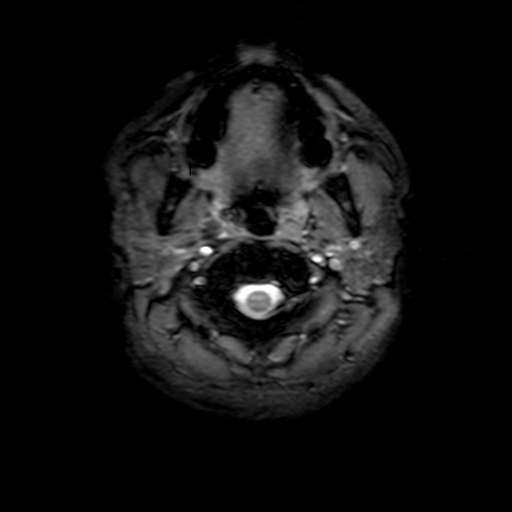
[im 10/28]
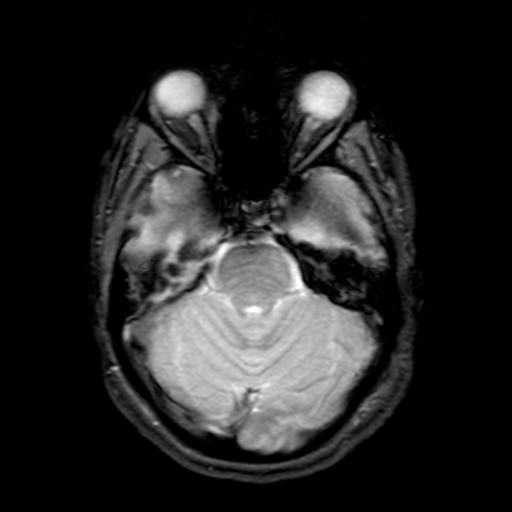
[im 19/28]
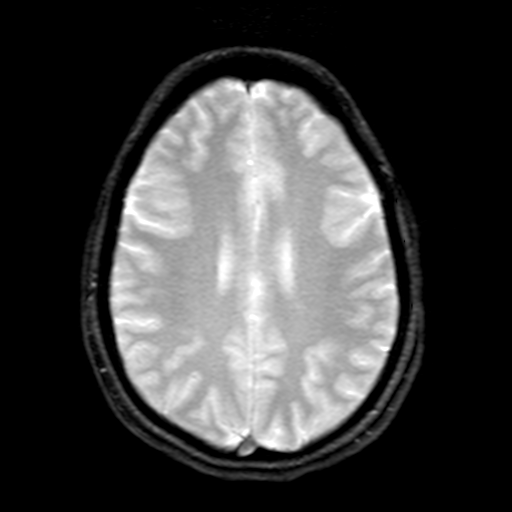
[im 28/28]
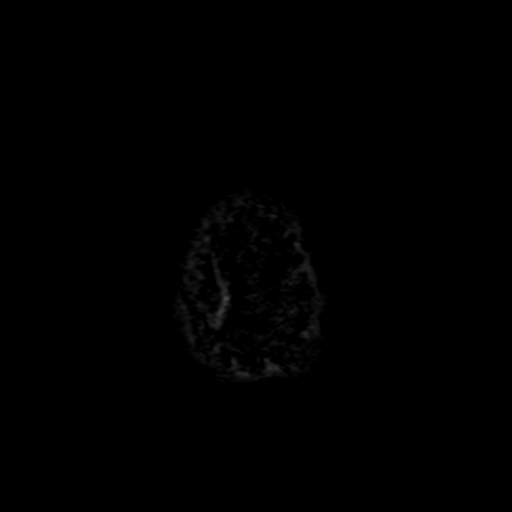

[Series 7: FLAIR · axial · 3.0mm · 0.43mm/px · z∈[-61,+102]mm · 6 of 42 slices shown]
[im 1/42]
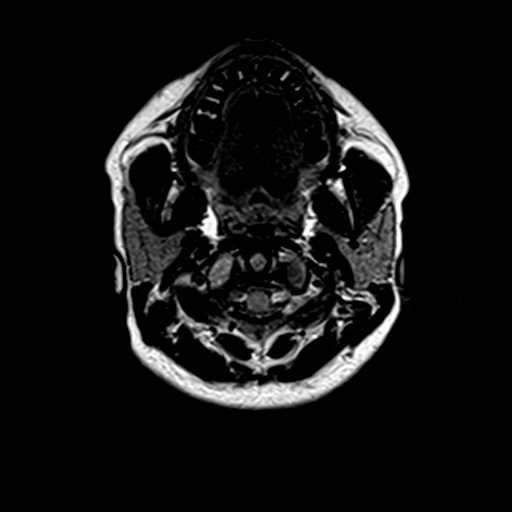
[im 9/42]
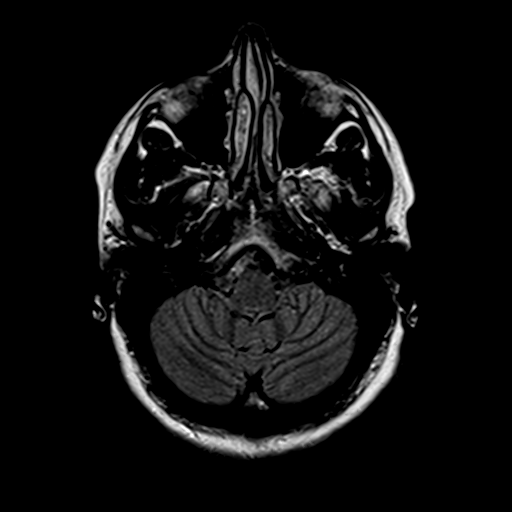
[im 17/42]
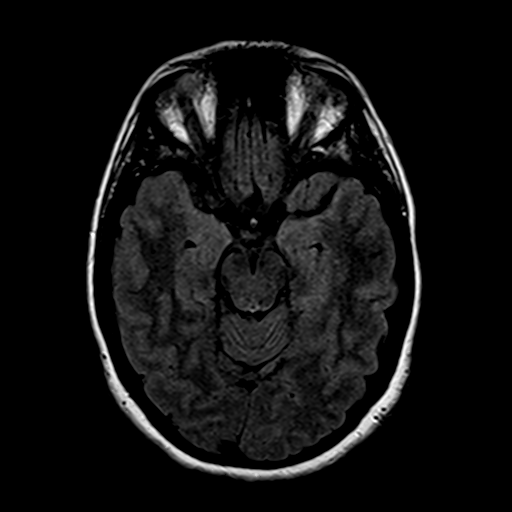
[im 25/42]
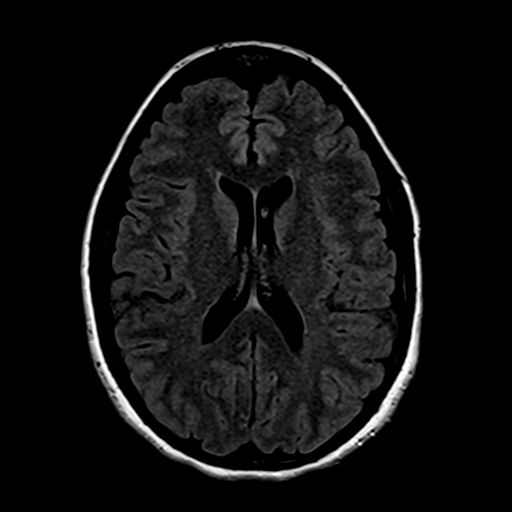
[im 33/42]
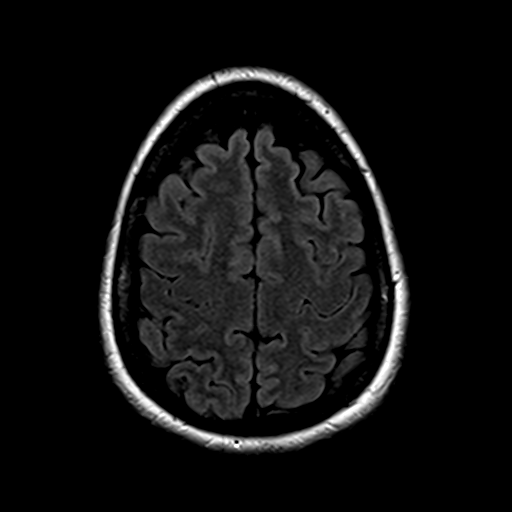
[im 42/42]
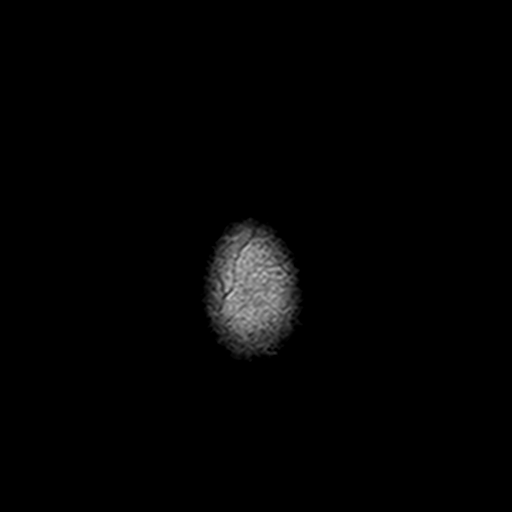

[Series 8: t1_3d_tra · axial · 2.0mm · 0.90mm/px · z∈[-73,+117]mm · 9 of 96 slices shown]
[im 1/96]
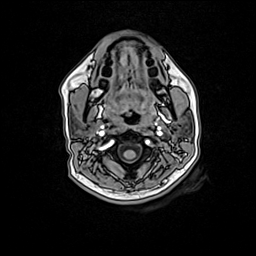
[im 8/96]
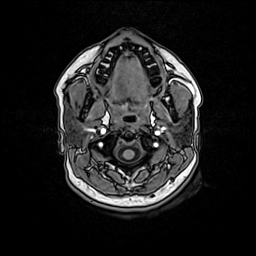
[im 16/96]
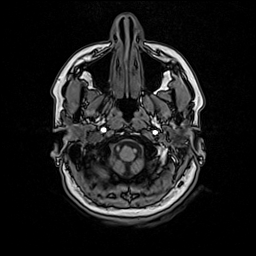
[im 32/96]
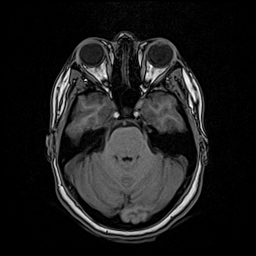
[im 40/96]
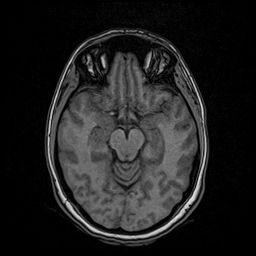
[im 56/96]
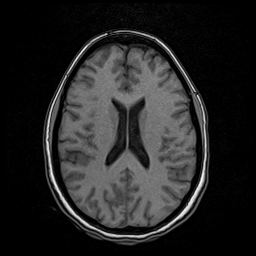
[im 64/96]
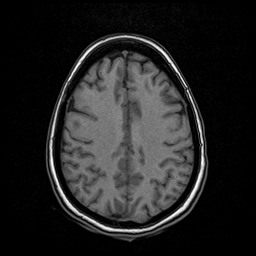
[im 80/96]
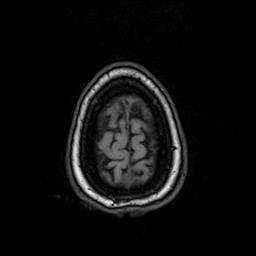
[im 96/96]
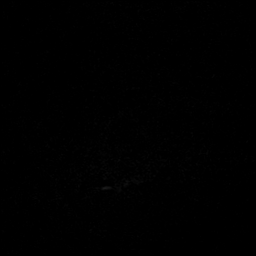

[Series 9: T2 · coronal · 5.0mm · 0.69mm/px · 4 of 30 slices shown (3 of 3)]
[im 1/30]
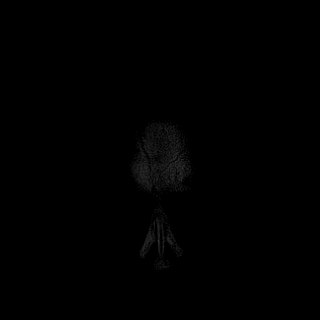
[im 10/30]
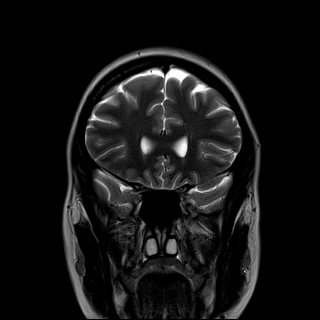
[im 20/30]
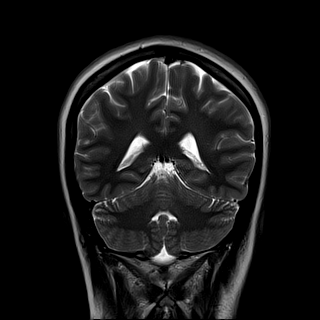
[im 30/30]
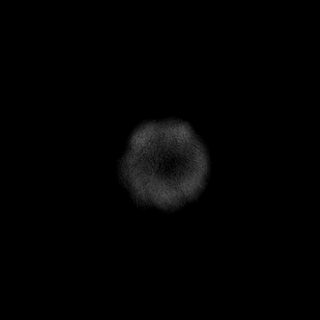

[Series 10: t1_3d_tra +c · axial · 2.0mm · 0.90mm/px · z∈[-73,+117]mm · 8 of 96 slices shown]
[im 1/96]
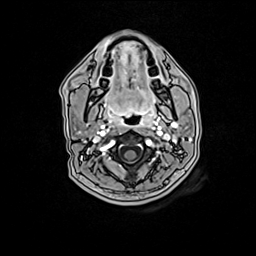
[im 16/96]
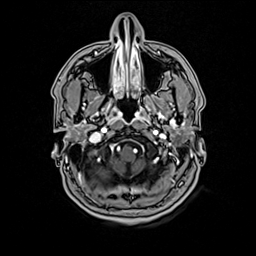
[im 32/96]
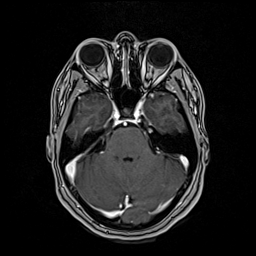
[im 40/96]
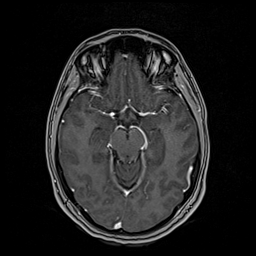
[im 56/96]
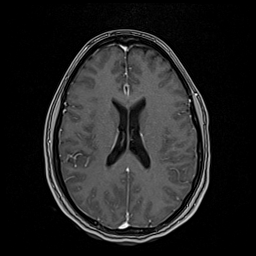
[im 64/96]
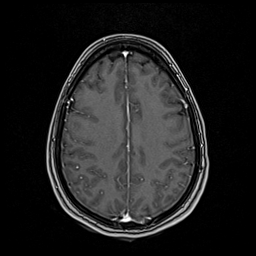
[im 80/96]
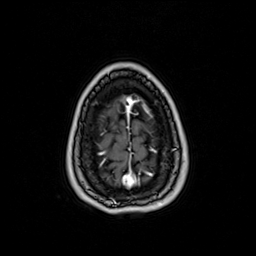
[im 96/96]
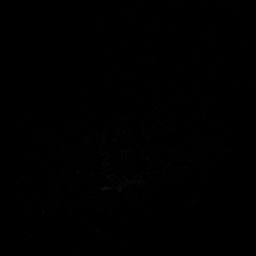

[Series 11: T1 · coronal · 5.0mm · 0.86mm/px · 4 of 30 slices shown]
[im 1/30]
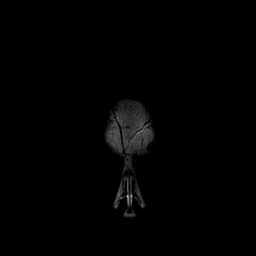
[im 10/30]
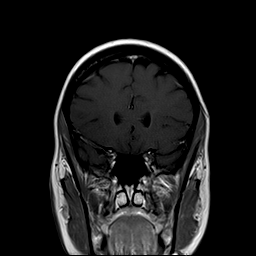
[im 20/30]
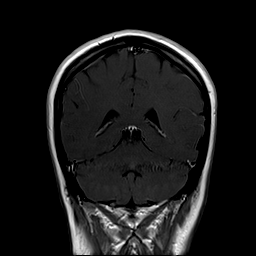
[im 30/30]
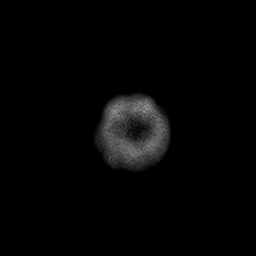

[39 of 48 positions shown; findings below may reference images not displayed]

FINDINGS: Brain: No infarction, hemorrhage, hydrocephalus, extra-axial
collection or mass lesion. No white matter disease.

Vascular: Normal flow voids and vascular enhancements

Skull and upper cervical spine: Negative for marrow lesion

Sinuses/Orbits: Negative.  No sinusitis.
IMPRESSION: Normal brain MRI.

## 2021-09-28 ENCOUNTER — Other Ambulatory Visit (HOSPITAL_COMMUNITY): Payer: Self-pay

## 2021-09-28 MED ORDER — DOLUTEGRAVIR SODIUM 50 MG PO TABS
50.0000 mg | ORAL_TABLET | Freq: Every day | ORAL | 0 refills | Status: DC
  Filled 2021-09-28: qty 5, 5d supply, fill #0

## 2021-09-28 MED ORDER — EMTRICITABINE-TENOFOVIR AF 200-25 MG PO TABS
1.0000 | ORAL_TABLET | Freq: Every day | ORAL | 0 refills | Status: DC
  Filled 2021-09-28: qty 5, 5d supply, fill #0

## 2021-10-03 ENCOUNTER — Other Ambulatory Visit (HOSPITAL_COMMUNITY): Payer: Self-pay

## 2021-10-03 MED ORDER — TIVICAY 50 MG PO TABS
50.0000 mg | ORAL_TABLET | Freq: Every day | ORAL | 0 refills | Status: AC
  Filled 2021-10-03: qty 25, 25d supply, fill #0

## 2021-10-03 MED ORDER — DESCOVY 200-25 MG PO TABS
1.0000 | ORAL_TABLET | Freq: Every day | ORAL | 0 refills | Status: AC
  Filled 2021-10-03: qty 25, 25d supply, fill #0

## 2021-10-04 ENCOUNTER — Other Ambulatory Visit (HOSPITAL_COMMUNITY): Payer: Self-pay

## 2022-05-30 ENCOUNTER — Other Ambulatory Visit: Payer: Self-pay | Admitting: Family

## 2022-05-30 DIAGNOSIS — N644 Mastodynia: Secondary | ICD-10-CM

## 2022-05-31 ENCOUNTER — Other Ambulatory Visit (HOSPITAL_BASED_OUTPATIENT_CLINIC_OR_DEPARTMENT_OTHER): Payer: Self-pay | Admitting: Family

## 2022-05-31 DIAGNOSIS — Z8249 Family history of ischemic heart disease and other diseases of the circulatory system: Secondary | ICD-10-CM

## 2022-06-05 ENCOUNTER — Other Ambulatory Visit: Payer: Self-pay | Admitting: Family

## 2022-06-05 DIAGNOSIS — N83209 Unspecified ovarian cyst, unspecified side: Secondary | ICD-10-CM

## 2022-06-12 ENCOUNTER — Ambulatory Visit
Admission: RE | Admit: 2022-06-12 | Discharge: 2022-06-12 | Disposition: A | Discharge status: Home / Self Care | Payer: BC Managed Care – PPO | Source: Ambulatory Visit | Attending: Family | Admitting: Family

## 2022-06-12 DIAGNOSIS — N644 Mastodynia: Secondary | ICD-10-CM

## 2022-09-10 ENCOUNTER — Ambulatory Visit
Admission: RE | Admit: 2022-09-10 | Discharge: 2022-09-10 | Disposition: A | Discharge status: Home / Self Care | Payer: No Typology Code available for payment source | Source: Ambulatory Visit | Attending: Family | Admitting: Family

## 2022-09-10 DIAGNOSIS — Z8249 Family history of ischemic heart disease and other diseases of the circulatory system: Secondary | ICD-10-CM

## 2022-09-10 DIAGNOSIS — N83209 Unspecified ovarian cyst, unspecified side: Secondary | ICD-10-CM

## 2022-09-10 MED ORDER — IOPAMIDOL (ISOVUE-300) INJECTION 61%
100.0000 mL | Freq: Once | INTRAVENOUS | Status: AC | PRN
  Administered 2022-09-10: 100 mL via INTRAVENOUS

## 2023-05-13 ENCOUNTER — Other Ambulatory Visit (HOSPITAL_COMMUNITY): Payer: Self-pay
